# Patient Record
Sex: Male | Born: 1960 | Race: Black or African American | Hispanic: No | Marital: Married | Smoking: Current every day smoker
Health system: Southern US, Community
[De-identification: ages and names within clinical notes are randomized; demographics above are authoritative.]

## PROBLEM LIST (undated history)

## (undated) DIAGNOSIS — R011 Cardiac murmur, unspecified: Secondary | ICD-10-CM

## (undated) DIAGNOSIS — I10 Essential (primary) hypertension: Secondary | ICD-10-CM

## (undated) HISTORY — DX: Essential (primary) hypertension: I10

## (undated) HISTORY — DX: Cardiac murmur, unspecified: R01.1

---

## 2004-08-22 ENCOUNTER — Encounter: Payer: Self-pay | Admitting: Orthopedic Surgery

## 2010-03-26 ENCOUNTER — Emergency Department: Payer: Self-pay | Admitting: Emergency Medicine

## 2014-01-15 ENCOUNTER — Emergency Department (HOSPITAL_COMMUNITY)
Admission: EM | Admit: 2014-01-15 | Discharge: 2014-01-15 | Disposition: A | Discharge status: Home / Self Care | Payer: PRIVATE HEALTH INSURANCE | Attending: Emergency Medicine | Admitting: Emergency Medicine

## 2014-01-15 ENCOUNTER — Emergency Department (HOSPITAL_COMMUNITY): Payer: PRIVATE HEALTH INSURANCE

## 2014-01-15 ENCOUNTER — Encounter (HOSPITAL_COMMUNITY): Payer: Self-pay | Admitting: Emergency Medicine

## 2014-01-15 DIAGNOSIS — F17200 Nicotine dependence, unspecified, uncomplicated: Secondary | ICD-10-CM | POA: Insufficient documentation

## 2014-01-15 DIAGNOSIS — R109 Unspecified abdominal pain: Secondary | ICD-10-CM | POA: Insufficient documentation

## 2014-01-15 LAB — COMPREHENSIVE METABOLIC PANEL
ALBUMIN: 3.8 g/dL (ref 3.5–5.2)
ALT: 18 U/L (ref 0–53)
ANION GAP: 8 (ref 5–15)
AST: 18 U/L (ref 0–37)
Alkaline Phosphatase: 63 U/L (ref 39–117)
BILIRUBIN TOTAL: 0.4 mg/dL (ref 0.3–1.2)
BUN: 15 mg/dL (ref 6–23)
CALCIUM: 9.3 mg/dL (ref 8.4–10.5)
CHLORIDE: 102 meq/L (ref 96–112)
CO2: 28 mEq/L (ref 19–32)
CREATININE: 1.27 mg/dL (ref 0.50–1.35)
GFR calc Af Amer: 74 mL/min — ABNORMAL LOW (ref >90)
GFR, EST NON AFRICAN AMERICAN: 63 mL/min — AB (ref >90)
Glucose, Bld: 107 mg/dL — ABNORMAL HIGH (ref 70–99)
Potassium: 4.3 mEq/L (ref 3.7–5.3)
Sodium: 138 mEq/L (ref 137–147)
Total Protein: 7.6 g/dL (ref 6.0–8.3)

## 2014-01-15 LAB — CBC WITH DIFFERENTIAL/PLATELET
Basophils Absolute: 0 10*3/uL (ref 0.0–0.1)
Basophils Relative: 0 % (ref 0–1)
Eosinophils Absolute: 0.1 10*3/uL (ref 0.0–0.7)
Eosinophils Relative: 1 % (ref 0–5)
HCT: 45.9 % (ref 39.0–52.0)
HEMOGLOBIN: 15.7 g/dL (ref 13.0–17.0)
Lymphocytes Relative: 17 % (ref 12–46)
Lymphs Abs: 1.6 10*3/uL (ref 0.7–4.0)
MCH: 29.8 pg (ref 26.0–34.0)
MCHC: 34.2 g/dL (ref 30.0–36.0)
MCV: 87.3 fL (ref 78.0–100.0)
MONO ABS: 0.6 10*3/uL (ref 0.1–1.0)
MONOS PCT: 6 % (ref 3–12)
NEUTROS ABS: 7.1 10*3/uL (ref 1.7–7.7)
Neutrophils Relative %: 76 % (ref 43–77)
Platelets: 289 10*3/uL (ref 150–400)
RBC: 5.26 MIL/uL (ref 4.22–5.81)
RDW: 13.8 % (ref 11.5–15.5)
WBC: 9.5 10*3/uL (ref 4.0–10.5)

## 2014-01-15 LAB — URINE MICROSCOPIC-ADD ON

## 2014-01-15 LAB — URINALYSIS, ROUTINE W REFLEX MICROSCOPIC
Bilirubin Urine: NEGATIVE
Glucose, UA: NEGATIVE mg/dL
Ketones, ur: NEGATIVE mg/dL
LEUKOCYTES UA: NEGATIVE
NITRITE: NEGATIVE
PH: 6 (ref 5.0–8.0)
PROTEIN: NEGATIVE mg/dL
Specific Gravity, Urine: 1.01 (ref 1.005–1.030)
Urobilinogen, UA: 0.2 mg/dL (ref 0.0–1.0)

## 2014-01-15 MED ORDER — TRAMADOL HCL 50 MG PO TABS: 50.0000 mg | ORAL_TABLET | Freq: Four times a day (QID) | ORAL | Status: DC | PRN

## 2014-01-15 MED ORDER — NAPROXEN 250 MG PO TABS: 250.0000 mg | ORAL_TABLET | Freq: Two times a day (BID) | ORAL | Status: DC | PRN

## 2014-01-15 MED ORDER — METHOCARBAMOL 500 MG PO TABS: 1000.0000 mg | ORAL_TABLET | Freq: Four times a day (QID) | ORAL | Status: DC | PRN

## 2014-01-15 NOTE — Discharge Instructions (Signed)
°Emergency Department Resource Guide °1) Find a Doctor and Pay Out of Pocket °Although you won't have to find out who is covered by your insurance plan, it is a good idea to ask around and get recommendations. You will then need to call the office and see if the doctor you have chosen will accept you as a new patient and what types of options they offer for patients who are self-pay. Some doctors offer discounts or will set up payment plans for their patients who do not have insurance, but you will need to ask so you aren't surprised when you get to your appointment. ° °2) Contact Your Local Health Department °Not all health departments have doctors that can see patients for sick visits, but many do, so it is worth a call to see if yours does. If you don't know where your local health department is, you can check in your phone book. The CDC also has a tool to help you locate your state's health department, and many state websites also have listings of all of their local health departments. ° °3) Find a Walk-in Clinic °If your illness is not likely to be very severe or complicated, you may want to try a walk in clinic. These are popping up all over the country in pharmacies, drugstores, and shopping centers. They're usually staffed by nurse practitioners or physician assistants that have been trained to treat common illnesses and complaints. They're usually fairly quick and inexpensive. However, if you have serious medical issues or chronic medical problems, these are probably not your best option. ° °No Primary Care Doctor: °- Call [facility] at  832-8000 - they can help you locate a primary care doctor that  accepts your insurance, provides certain services, etc. °- Physician Referral Service- 1-800-533-3463 ° °Chronic Pain Problems: °Organization         Address  Phone   Notes  °[facility]  (336) 297-2271 Patients need to be referred by their primary care doctor.  ° °Medication  Assistance: °Organization         Address  Phone   Notes  °[place] [facility] [address] °[place], [place] [address] (336) 641-8030 --Must be a resident of [place] °-- Must have NO insurance coverage whatsoever (no Medicaid/ Medicare, etc.) °-- The pt. MUST have a primary care doctor that directs their care regularly and follows them in the community °  °[facility]  (866) 331-1348   °[employer]  (888) 892-1162   ° °Agencies that provide inexpensive medical care: °Organization         Address  Phone   Notes  °[facility]  (336) 832-8035   °[facility]    (336) 832-7272   °[facility] [address] °[place], [place] [address] (336) 832-4777   °[facility] [address], °[place] (336) 271-4999   °[facility]    (336) 373-0678   °[facility]    (336) 272-1050   °[facility] ° [address], [place] Phone:  (336) 832-4444, Fax:  (336) 832-4440 Hours of Operation:  9 am - 6 pm, M-F.  Also accepts Medicaid/Medicare and self-pay.  °[facility] ° [address], [place] Phone: (336) 832-3150, Fax: (336) 832-3151. Hours of Operation:  8:30 am - 5:30 pm, M-F.  Also accepts Medicaid and self-pay.  °[facility] [place] [address], [place] Phone: (336) 878-6027   °[facility] [address], [place], [place] (336)723-1848, Ext. 123 Mondays & Thursdays: 7-9 AM.  First 15 patients are seen on a first come, first serve basis. °  ° °Medicaid-accepting [place] Providers: ° °Organization         Address  Phone   Notes  °[facility] [address], [place] (336) 641-2100 Also accepts self-pay patients.  °[facility] [address], [place] ° (336) 856-9996   °[facility] [address], [place]  (336) 288-8857   °[facility] [address] [place] [address], [place] (336) 299-7000   °Veita Bland [address], [place]  ° (336) 373-1557 Only accepts [place] Access Medicaid patients after they have their name applied to their card.  ° °Self-Pay (no insurance) in [place]: ° °Organization         Address  Phone   Notes  °Sickle Cell Patients, [facility] [address], [place] (336) 832-1970   °[facility] [address], [place] (336) 832-4400   ° [facility] ° [address], [place] (336) 992-4800   °[facility]/Dr. Osei-Bonsu ° [address] [place] [address], [place] or [address], [place] (336) 841-8500 Phone number for both [place] and [place] locations is the same.  °[facility] [address], [place] (336) 299-0000   °[facility] [address] [place] [address], [place] or [address] (336) 852-7530 °(336) 878-2260   °[facility] [address], [place] (336) 350-1642, phone; (336) 294-5005, fax Sees patients 1st and 3rd Saturday of every month.  Must not qualify for public or private insurance (i.e. Medicaid, Medicare, [place] Health Choice, Veterans' Benefits) • Household income should be no more than 200% of the poverty level •The clinic cannot treat you if you are pregnant or think you are pregnant • Sexually transmitted diseases are not treated at the clinic.  ° ° °Dental Care: °Organization         Address  Phone  Notes  °[place] [facility] [address], [place] (336) 641-6152 Accepts children up to age 21 who are enrolled in Medicaid or [place] Health Choice; pregnant women with a Medicaid card; and children who have applied for Medicaid or [place] Health Choice, but were declined, whose parents can pay a reduced fee at time of service.  °[place]  [facility] [place]  [address], [place] (336) 641-7733 Accepts children up to age 21 who are enrolled in Medicaid or [place] Health Choice; pregnant women with a Medicaid card; and children who have applied for Medicaid or [place] Health Choice, but were declined, whose parents can pay a reduced fee at time of service.  °[facility] ° [address], [place] (336) 641-4533 Patients are seen by appointment only. Walk-ins are not accepted. [facility] will see patients 18 years of age and older. °Monday - Tuesday (8am-5pm) °Most Wednesdays (8:30-5pm) °$30 per visit, cash only  °[facility] ° [address], [place] (336) 641-4533 Patients are seen by appointment only. Walk-ins are not accepted. [facility] will see patients 18 years of age and older. °One   Wednesday Evening (Monthly: Volunteer Based).  $30 per visit, cash only  °[facility]  (919) 537-3737 for adults; Children under age 4, call Graduate Pediatric Dentistry at ([phone]) [phone]. Children aged 4-14, please call (919) [phone] to request a pediatric application. ° Dental services are provided in all areas of dental care including fillings, crowns and bridges, complete and partial dentures, implants, gum treatment, root canals, and extractions. Preventive care is also provided. Treatment is provided to both adults and children. °Patients are selected via a lottery and there is often a waiting list. °  °[facility] [address], °[place] ° (336) 763-8833 [email or web address] °  °[facility] [address], [place], [place] (336)723-1848, Ext. 123 Second and Fourth Thursday of each month, opens at 6:30 AM; Clinic ends at 9 AM.  Patients are seen on a first-come first-served basis, and a limited number are seen during each clinic.  ° °[facility] ° [address], [place], [place] (336) [phone]    Eligibility Requirements °You must have lived in [place], [place], or [place] counties for at least the last three months. °  You cannot be eligible for state or federal sponsored healthcare insurance, including Veterans Administration, Medicaid, or Medicare. °  You generally cannot be eligible for healthcare insurance through your employer.  °  How to apply: °Eligibility screenings are held every Tuesday and Wednesday afternoon from 1:00 pm until 4:00 pm. You do not need an appointment for the interview!  °[facility] [address], [place], [place] 336-631-2330   °[facility]  336-342-8273   °[facility]  336-703-3100   °[facility]  336-570-6415   ° °Behavioral Health Resources in the Community: °Intensive Outpatient Programs °Organization         Address  Phone  Notes  °[facility] [address], [place], [place] 336-878-6098   °[facility] [address], [place], [place] 336-832-9800   °[facility] [address], [place], [place] ° 336-882-2125   °[facility] [address],  °[place], [place] 1-800-853-5163 or 336-641-4981   °Substance Abuse Resources °Organization         Address  Phone  Notes  °[facility]  336-882-2125   °[facility]  336-784-9470   °[facility]  336-285-9073   °[facility]  336-845-3988   °Residential & Outpatient Substance Abuse Program  1-800-659-3381   °Psychological Services °Organization         Address  Phone  Notes  °[facility]  336- 832-9600   °[facility]  336- 378-7881   °[facility] [address], [place] 1-800-853-5163 or 336-641-4981   ° °Mobile Crisis Teams °Organization         Address  Phone  Notes  °[facility], Mobile Crisis Care Unit  1-877-626-1772   °Assertive °[facility] ° [address]  [place], [place] 336-834-9664   °Sharon DeEsch [address] °[place] 336-554-5454   ° °Self-Help/Support Groups °Organization         Address  Phone             Notes  °[facility] - variety of support groups  336- 373-1402 Call for more information  °Narcotics Anonymous (NA), [facility] [address], °[place]  2 meetings at this location  ° °  Residential Treatment Programs °Organization         Address  Phone  Notes  °[facility] [address],    °[place]  1-866-801-8205   °[facility] ° [address], [place], [place] 704-293-8524   °[facility] [address], [place] 336-845-3988 Admissions: 8am-3pm M-F  °[facility] [address]    °[place], [place] 336-841-1104   °[facility] [address], [place], [place] 336-379-7146   °[facility] [address]  °[place], [place] 336-285-9073   °[facility] - Intensive Outpatient [address], [place], [place] 336-852-3033   °[facility] [address]  °[place], [place] 1-877-615-2722 or 336-784-9470   °[facility] [address] [place], [place] 336-227-7417 Accepts Medicaid  °[facility] [address]  °[place] 1-800-659-3381 Substance Abuse/Addiction Treatment  ° °[place] Behavioral Health Resources °Organization         Address  Phone  Notes  °[facility]  (888) 581-9988   °Julie Brannon, PhD [address] [place], [place]   (336) 349-5553 or (336) 951-0000   °[facility]   [address] °[place], [place] (336) 349-4454   °[facility] [address], [place], [place] (336) 342-8316 Insurance/Medicaid/sponsorship through [facility]  °[facility] [address]                                    [place], [place] (336) 342-8316 Therapy/tele-psych/case    °[facility] [address]  ° [place], [place] (336) 349-2233    °Dr. Arfeen  (336) 349-4544   °[facility] [place]  [employer] [place] [facility] 1) [address], [place] °2) [address], [place] °3)  [address], [place] (336) 349-3220 °(336) 342-7768 ° °(336) 342-8140   °[place] [facility] (336) 342-1394 or (336) 342-3537 (After Hours)    ° ° °Take the prescriptions as directed.  Apply moist heat or ice to the area(s) of discomfort, for 15 minutes at a time, several times per day for the next few days.  Do not fall asleep on a heating or ice pack.  Call your regular medical doctor on Monday to schedule a follow up appointment this week.  Return to the Emergency Department immediately if worsening. ° °

## 2014-01-15 NOTE — ED Provider Notes (Signed)
CSN: [id number]     Arrival date & time 01/15/14  1235 History   First MD Initiated Contact with Patient 01/15/14 1357     Chief Complaint  Patient presents with  . Flank Pain     HPI Pt was seen at 1410. Per pt, c/o sudden onset and persistence of waxing and waning right sided flank "pain" for the past 1 week.  Pt describes the pain as "throbbing," and radiating into the right side of his abd. Pain worsens with certain body positions.  Denies injury, no testicular pain/swelling, no dysuria/hematuria, no abd pain, no N/V, no diarrhea, no CP/SOB.    History reviewed. No pertinent past medical history.  History reviewed. No pertinent past surgical history.  History  Substance Use Topics  . Smoking status: Current Every Day Smoker -- 1.00 packs/day    Types: Cigarettes  . Smokeless tobacco: Not on file  . Alcohol Use: Yes     Comment: on weekends    Review of Systems ROS: Statement: All systems negative except as marked or noted in the HPI; Constitutional: Negative for fever and chills. ; ; Eyes: Negative for eye pain, redness and discharge. ; ; ENMT: Negative for ear pain, hoarseness, nasal congestion, sinus pressure and sore throat. ; ; Cardiovascular: Negative for chest pain, palpitations, diaphoresis, dyspnea and peripheral edema. ; ; Respiratory: Negative for cough, wheezing and stridor. ; ; Gastrointestinal: Negative for nausea, vomiting, diarrhea, abdominal pain, blood in stool, hematemesis, jaundice and rectal bleeding. . ; ; Genitourinary: +right sided flank pain. Negative for dysuria and hematuria. ; ; Genital:  No penile drainage or rash, no testicular pain or swelling, no scrotal rash or swelling. ;; Musculoskeletal: Negative for back pain and neck pain. Negative for swelling and trauma.; ; Skin: Negative for pruritus, rash, abrasions, blisters, bruising and skin lesion.; ; Neuro: Negative for headache, lightheadedness and neck stiffness. Negative for weakness, altered level of  consciousness , altered mental status, extremity weakness, paresthesias, involuntary movement, seizure and syncope.      Allergies  Review of patient's allergies indicates no known allergies.  Home Medications   Prior to Admission medications   Not on File   BP 169/109  Pulse 76  Temp(Src) 98.6 F (37 C) (Oral)  Resp 16  Ht 5\' 6"  (1.676 m)  Wt 150 lb (68.04 kg)  BMI 24.22 kg/m2  SpO2 100% Physical Exam 1415: Physical examination:  Nursing notes reviewed; Vital signs and O2 SAT reviewed;  Constitutional: Well developed, Well nourished, Well hydrated, In no acute distress; Head:  Normocephalic, atraumatic; Eyes: EOMI, PERRL, No scleral icterus; ENMT: Mouth and pharynx normal, Mucous membranes moist; Neck: Supple, Full range of motion, No lymphadenopathy; Cardiovascular: Regular rate and rhythm, No murmur, rub, or gallop; Respiratory: Breath sounds clear & equal bilaterally, No rales, rhonchi, wheezes.  Speaking full sentences with ease, Normal respiratory effort/excursion; Chest: Nontender, Movement normal; Abdomen: Soft, Nontender, Nondistended, Normal bowel sounds; Genitourinary: No CVA tenderness; Spine:  No midline CS, TS, LS tenderness.;; Extremities: Pulses normal, No tenderness, No edema, No calf edema or asymmetry.; Neuro: AA&Ox3, Major CN grossly intact.  Speech clear. No gross focal motor or sensory deficits in extremities. Climbs on and off stretcher easily by himself. Gait steady.; Skin: Color normal, Warm, Dry.   ED Course  Procedures     EKG Interpretation None      MDM  MDM Reviewed: nursing note and vitals Interpretation: CT scan and labs   Results for orders placed during the hospital encounter  of 01/15/14  CBC WITH DIFFERENTIAL      Result Value Ref Range   WBC 9.5  4.0 - 10.5 K/uL   RBC 5.26  4.22 - 5.81 MIL/uL   Hemoglobin 15.7  13.0 - 17.0 g/dL   HCT 45.9  39.0 - 52.0 %   MCV 87.3  78.0 - 100.0 fL   MCH 29.8  26.0 - 34.0 pg   MCHC 34.2  30.0 - 36.0  g/dL   RDW 13.8  11.5 - 15.5 %   Platelets 289  150 - 400 K/uL   Neutrophils Relative % 76  43 - 77 %   Neutro Abs 7.1  1.7 - 7.7 K/uL   Lymphocytes Relative 17  12 - 46 %   Lymphs Abs 1.6  0.7 - 4.0 K/uL   Monocytes Relative 6  3 - 12 %   Monocytes Absolute 0.6  0.1 - 1.0 K/uL   Eosinophils Relative 1  0 - 5 %   Eosinophils Absolute 0.1  0.0 - 0.7 K/uL   Basophils Relative 0  0 - 1 %   Basophils Absolute 0.0  0.0 - 0.1 K/uL  COMPREHENSIVE METABOLIC PANEL      Result Value Ref Range   Sodium 138  137 - 147 mEq/L   Potassium 4.3  3.7 - 5.3 mEq/L   Chloride 102  96 - 112 mEq/L   CO2 28  19 - 32 mEq/L   Glucose, Bld 107 (*) 70 - 99 mg/dL   BUN 15  6 - 23 mg/dL   Creatinine, Ser 1.27  0.50 - 1.35 mg/dL   Calcium 9.3  8.4 - 10.5 mg/dL   Total Protein 7.6  6.0 - 8.3 g/dL   Albumin 3.8  3.5 - 5.2 g/dL   AST 18  0 - 37 U/L   ALT 18  0 - 53 U/L   Alkaline Phosphatase 63  39 - 117 U/L   Total Bilirubin 0.4  0.3 - 1.2 mg/dL   GFR calc non Af Amer 63 (*) >90 mL/min   GFR calc Af Amer 74 (*) >90 mL/min   Anion gap 8  5 - 15  URINALYSIS, ROUTINE W REFLEX MICROSCOPIC      Result Value Ref Range   Color, Urine YELLOW  YELLOW   APPearance CLEAR  CLEAR   Specific Gravity, Urine 1.010  1.005 - 1.030   pH 6.0  5.0 - 8.0   Glucose, UA NEGATIVE  NEGATIVE mg/dL   Hgb urine dipstick SMALL (*) NEGATIVE   Bilirubin Urine NEGATIVE  NEGATIVE   Ketones, ur NEGATIVE  NEGATIVE mg/dL   Protein, ur NEGATIVE  NEGATIVE mg/dL   Urobilinogen, UA 0.2  0.0 - 1.0 mg/dL   Nitrite NEGATIVE  NEGATIVE   Leukocytes, UA NEGATIVE  NEGATIVE  URINE MICROSCOPIC-ADD ON      Result Value Ref Range   Squamous Epithelial / LPF RARE  RARE   WBC, UA 0-2  <3 WBC/hpf   RBC / HPF 0-2  <3 RBC/hpf   Bacteria, UA RARE  RARE   Ct Abdomen Pelvis Wo Contrast 01/15/2014   CLINICAL DATA:  Right flank pain.  EXAM: CT ABDOMEN AND PELVIS WITHOUT CONTRAST  TECHNIQUE: Multidetector CT imaging of the abdomen and pelvis was performed  following the standard protocol without IV contrast.  COMPARISON:  None.  FINDINGS: Visualized lung bases appear normal. No significant osseous abnormality is noted.  No gallstones are noted. No focal abnormality is noted in the liver,  spleen or pancreas on these unenhanced images. Adrenal glands appear normal. No hydronephrosis or renal obstruction is noted. No renal or ureteral calculi are noted. The appendix appears normal. There is no evidence of bowel obstruction. No abnormal fluid collection is noted. Urinary bladder appears normal. No significant adenopathy is noted.  IMPRESSION: No hydronephrosis or renal obstruction is noted. No renal or ureteral calculi are noted. No acute abnormality seen in the abdomen or pelvis.   Electronically Signed   By: Sabino Dick M.D.   On: 01/15/2014 16:00    1620:  Workup reassuring. Pt wants to go home now. Dx and testing d/w pt and family.  Questions answered.  Verb understanding, agreeable to d/c home with outpt f/u.   Francine Graven, DO 01/18/14 2214

## 2014-01-15 NOTE — ED Notes (Signed)
Pt states that he had right sided flank pain that started on 9/23. Pt states the pain is worse when he lays down. Denies any change in urination.

## 2016-05-02 ENCOUNTER — Encounter: Payer: Self-pay | Admitting: Orthopedic Surgery

## 2016-11-08 ENCOUNTER — Encounter: Payer: Self-pay | Admitting: Urology

## 2016-11-08 ENCOUNTER — Ambulatory Visit (INDEPENDENT_AMBULATORY_CARE_PROVIDER_SITE_OTHER): Payer: PRIVATE HEALTH INSURANCE | Admitting: Urology

## 2016-11-08 VITALS — BP 133/80 | HR 76 | Resp 12 | Ht 67.0 in | Wt 143.0 lb

## 2016-11-08 DIAGNOSIS — R3121 Asymptomatic microscopic hematuria: Secondary | ICD-10-CM

## 2016-11-08 LAB — URINALYSIS, COMPLETE
Bilirubin, UA: NEGATIVE
GLUCOSE, UA: NEGATIVE
Ketones, UA: NEGATIVE
Leukocytes, UA: NEGATIVE
Nitrite, UA: NEGATIVE
Protein, UA: NEGATIVE
Specific Gravity, UA: 1.015 (ref 1.005–1.030)
Urobilinogen, Ur: 0.2 mg/dL (ref 0.2–1.0)
pH, UA: 5.5 (ref 5.0–7.5)

## 2016-11-08 LAB — MICROSCOPIC EXAMINATION
Bacteria, UA: NONE SEEN
Epithelial Cells (non renal): NONE SEEN /hpf (ref 0–10)
WBC UA: NONE SEEN /HPF (ref 0–?)

## 2016-11-08 NOTE — Progress Notes (Signed)
11/08/2016 1:30 PM   David Carney 1961/04/10 [id number]  Referring provider: Ivan Anchors, FNP [address] [place], [place] [address]  Chief Complaint  Patient presents with  . Other    Painless hematuria    HPI: The patient is a 56 year old gentleman presents today to discuss microscopic hematuria.  1. Microscopic hematuria Patient with multiple urinalysis that showed red blood cells on microscopy. He notes no gross hematuria or clots. No urinary tract infections.  He is known history of nephrolithiasis. He does currently smoke. He has no other complaints at this time. On questioning, he does endorse a weak stream but he feels he empties his bladder and has no nocturia. He takes no medication for an enlarged prostate. Currently not significantly bothered by symptoms.   PMH: Past Medical History:  Diagnosis Date  . Heart murmur   . Hypertension     Surgical History: No past surgical history on file.  Home Medications:  Allergies as of 11/08/2016      Reactions   Codeine Swelling      Medication List       Accurate as of 11/08/16  1:30 PM. Always use your most recent med list.          aspirin EC 81 MG tablet Take 81 mg by mouth daily.   Fish Oil 1200 MG Caps Take 1,200 mg by mouth daily.   hydrochlorothiazide 25 MG tablet Commonly known as:  HYDRODIURIL Take 25 mg by mouth daily.       Allergies:  Allergies  Allergen Reactions  . Codeine Swelling    Family History: No family history on file.  Social History:  reports that he has been smoking Cigarettes.  He has been smoking about 1.00 pack per day. He has never used smokeless tobacco. He reports that he drinks alcohol. He reports that he does not use drugs.  ROS: UROLOGY Frequent Urination?: No Hard to postpone urination?: No Burning/pain with urination?: No Get up at night to urinate?: No Leakage of urine?: No Urine stream starts and stops?: No Trouble starting stream?: No Do  you have to strain to urinate?: No Blood in urine?: Yes Urinary tract infection?: No Sexually transmitted disease?: No Injury to kidneys or bladder?: No Painful intercourse?: No Weak stream?: No Erection problems?: No Penile pain?: No  Gastrointestinal Nausea?: No Vomiting?: No Indigestion/heartburn?: Yes Diarrhea?: No Constipation?: Yes  Constitutional Fever: No Night sweats?: No Weight loss?: No Fatigue?: No  Skin Skin rash/lesions?: Yes Itching?: Yes  Eyes Blurred vision?: Yes Double vision?: No  Ears/Nose/Throat Sore throat?: No Sinus problems?: Yes  Hematologic/Lymphatic Swollen glands?: No Easy bruising?: No  Cardiovascular Leg swelling?: No Chest pain?: No  Respiratory Cough?: No Shortness of breath?: No  Endocrine Excessive thirst?: No  Musculoskeletal Back pain?: Yes Joint pain?: Yes  Neurological Headaches?: Yes Dizziness?: No  Psychologic Depression?: No Anxiety?: No  Physical Exam: BP 133/80   Pulse 76   Resp 12   Ht 5\' 7"  (1.702 m)   Wt 143 lb (64.9 kg)   BMI 22.40 kg/m   Constitutional:  Alert and oriented, No acute distress. HEENT: [place] AT, moist mucus membranes.  Trachea midline, no masses. Cardiovascular: No clubbing, cyanosis, or edema. Respiratory: Normal respiratory effort, no increased work of breathing. GI: Abdomen is soft, nontender, nondistended, no abdominal masses GU: No CVA tenderness.  Skin: No rashes, bruises or suspicious lesions. Lymph: No cervical or inguinal adenopathy. Neurologic: Grossly intact, no focal deficits, moving all 4  extremities. Psychiatric: Normal mood and affect.  Laboratory Data: Lab Results  Component Value Date   WBC 9.5 01/15/2014   HGB 15.7 01/15/2014   HCT 45.9 01/15/2014   MCV 87.3 01/15/2014   PLT 289 01/15/2014    Lab Results  Component Value Date   CREATININE 1.27 01/15/2014    No results found for: PSA  No results found for: TESTOSTERONE  No results found for:  HGBA1C  Urinalysis    Component Value Date/Time   COLORURINE YELLOW 01/15/2014 1310   APPEARANCEUR CLEAR 01/15/2014 1310   LABSPEC 1.010 01/15/2014 1310   PHURINE 6.0 01/15/2014 1310   GLUCOSEU NEGATIVE 01/15/2014 1310   HGBUR SMALL (A) 01/15/2014 1310   BILIRUBINUR NEGATIVE 01/15/2014 1310   KETONESUR NEGATIVE 01/15/2014 1310   PROTEINUR NEGATIVE 01/15/2014 1310   UROBILINOGEN 0.2 01/15/2014 1310   NITRITE NEGATIVE 01/15/2014 [place] 01/15/2014 1310    Assessment & Plan:    1. Microscopic hematuria I discussed the patient the classic hematuria workup. He is agreeable to proceeding. He'll follow-up for cystoscopy after undergoing CT urogram  Return for after CT for cystoscopy.  Nickie Retort, MD  [facility] [address] [place], [place] [address] [phone]

## 2016-12-13 ENCOUNTER — Encounter (HOSPITAL_COMMUNITY): Payer: Self-pay

## 2016-12-13 ENCOUNTER — Ambulatory Visit (HOSPITAL_COMMUNITY)
Admission: RE | Admit: 2016-12-13 | Discharge: 2016-12-13 | Disposition: A | Discharge status: Home / Self Care | Payer: PRIVATE HEALTH INSURANCE | Source: Ambulatory Visit | Attending: Urology | Admitting: Urology

## 2016-12-13 DIAGNOSIS — D1809 Hemangioma of other sites: Secondary | ICD-10-CM | POA: Insufficient documentation

## 2016-12-13 DIAGNOSIS — R911 Solitary pulmonary nodule: Secondary | ICD-10-CM | POA: Insufficient documentation

## 2016-12-13 DIAGNOSIS — R3121 Asymptomatic microscopic hematuria: Secondary | ICD-10-CM | POA: Diagnosis present

## 2016-12-13 MED ORDER — IOPAMIDOL (ISOVUE-300) INJECTION 61%
125.0000 mL | Freq: Once | INTRAVENOUS | Status: AC | PRN
  Administered 2016-12-13: 125 mL via INTRAVENOUS

## 2016-12-20 ENCOUNTER — Encounter: Payer: Self-pay | Admitting: Urology

## 2016-12-20 ENCOUNTER — Ambulatory Visit (INDEPENDENT_AMBULATORY_CARE_PROVIDER_SITE_OTHER): Payer: PRIVATE HEALTH INSURANCE | Admitting: Urology

## 2016-12-20 VITALS — BP 122/67 | HR 91 | Ht 67.0 in | Wt 145.2 lb

## 2016-12-20 DIAGNOSIS — R3121 Asymptomatic microscopic hematuria: Secondary | ICD-10-CM

## 2016-12-20 LAB — URINALYSIS, COMPLETE
Bilirubin, UA: NEGATIVE
Glucose, UA: NEGATIVE
KETONES UA: NEGATIVE
LEUKOCYTES UA: NEGATIVE
Nitrite, UA: NEGATIVE
PH UA: 5.5 (ref 5.0–7.5)
Protein, UA: NEGATIVE
SPEC GRAV UA: 1.015 (ref 1.005–1.030)
Urobilinogen, Ur: 0.2 mg/dL (ref 0.2–1.0)

## 2016-12-20 LAB — MICROSCOPIC EXAMINATION
BACTERIA UA: NONE SEEN
EPITHELIAL CELLS (NON RENAL): NONE SEEN /HPF (ref 0–10)
WBC, UA: NONE SEEN /hpf (ref 0–?)

## 2016-12-20 MED ORDER — LIDOCAINE HCL 2 % EX GEL
1.0000 "application " | Freq: Once | CUTANEOUS | Status: AC
  Administered 2016-12-20: 1 via URETHRAL

## 2016-12-20 MED ORDER — CIPROFLOXACIN HCL 500 MG PO TABS
500.0000 mg | ORAL_TABLET | Freq: Once | ORAL | Status: AC
  Administered 2016-12-20: 500 mg via ORAL

## 2016-12-20 NOTE — Progress Notes (Signed)
   12/20/16  CC:  Chief Complaint  Patient presents with  . Cysto    HPI: The patient is a 56 year old gentleman presents today for completion of his microscopic hematuria workup. His CT scan showed no obvious source for his microscopic hematuria.  There were no vitals taken for this visit. NED. A&Ox3.   No respiratory distress   Abd soft, NT, ND Normal phallus with bilateral descended testicles  Cystoscopy Procedure Note  Patient identification was confirmed, informed consent was obtained, and patient was prepped using Betadine solution.  Lidocaine jelly was administered per urethral meatus.    Preoperative abx where received prior to procedure.     Pre-Procedure: - Inspection reveals a normal caliber ureteral meatus.  Procedure: The flexible cystoscope was introduced without difficulty - No urethral strictures/lesions are present. - Enlarged prostate Hypervascular - Normal bladder neck - Bilateral ureteral orifices identified - Bladder mucosa  reveals no ulcers, tumors, or lesions - No bladder stones - No trabeculation  Retroflexion shows no intravesical lobe   Post-Procedure: - Patient tolerated the procedure well  Assessment/ Plan:  1. Microscopic hematuria -Negative workup. Urinalysis in one year. 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  2. Lung nodule Patient with incidental 3 mm left lower lobe pulmonary nodule. No follow-up is needed if he is low risk, but a CT scan in 1 year is recommended if he is high-risk per the radiologist. I have given him a copy of this report discusses risk category and to assess if he needs further imaging with his primary care provider. He expressed an understanding.

## 2017-12-19 ENCOUNTER — Ambulatory Visit: Payer: PRIVATE HEALTH INSURANCE

## 2018-07-20 IMAGING — CT CT ABD-PEL WO/W CM
3 of 12 series · 12 of 46 positions shown, 18 images · IV contrast (iopamidol)
Comparison: 01/15/2014

CLINICAL DATA: Discolored urine. Microscopic hematuria. Dysuria.
History of renal calculi.

EXAM:
CT ABDOMEN AND PELVIS WITHOUT AND WITH CONTRAST
TECHNIQUE: Multidetector CT imaging of the abdomen and pelvis was performed
following the standard protocol before and following the bolus
administration of intravenous contrast.
CONTRAST:  125mL 24T3LY-MBB IOPAMIDOL (24T3LY-MBB) INJECTION 61%

[Series 2: axial pre · axial · non-contrast · 0.72mm/px · z∈[+1140,+1470]mm · 7 of 88 slices shown, 12 images]
[im 11/88  soft-tissue]
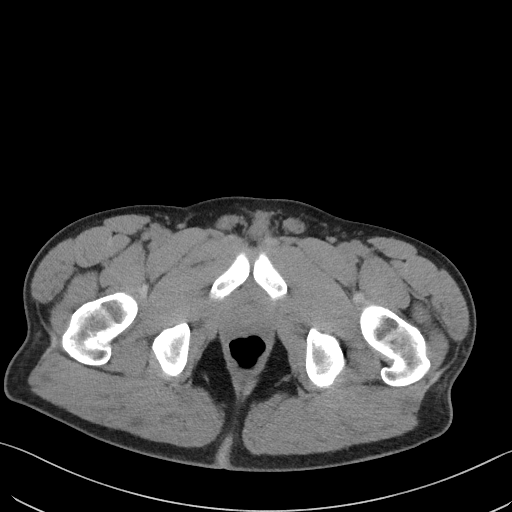
[im 11/88  bone]
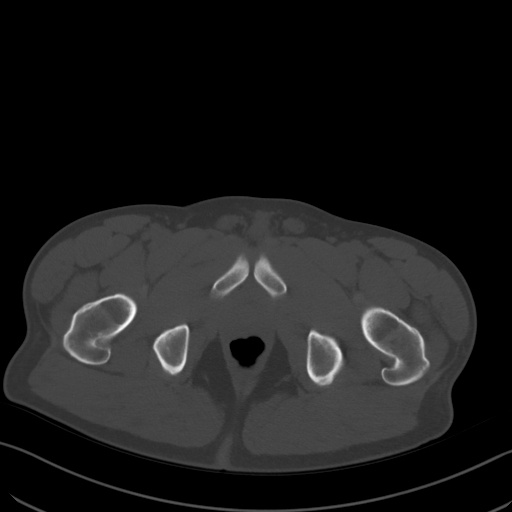
[im 22/88  soft-tissue]
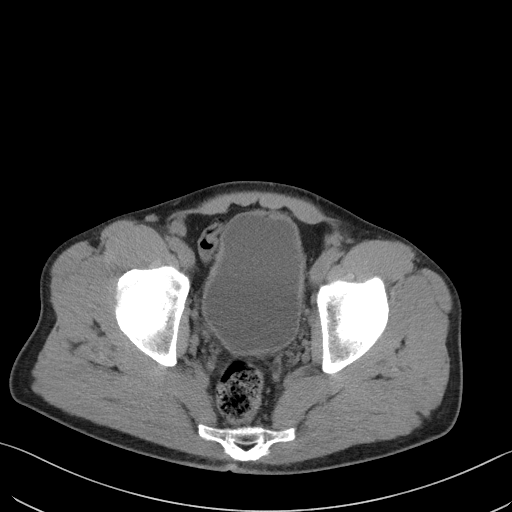
[im 33/88  soft-tissue]
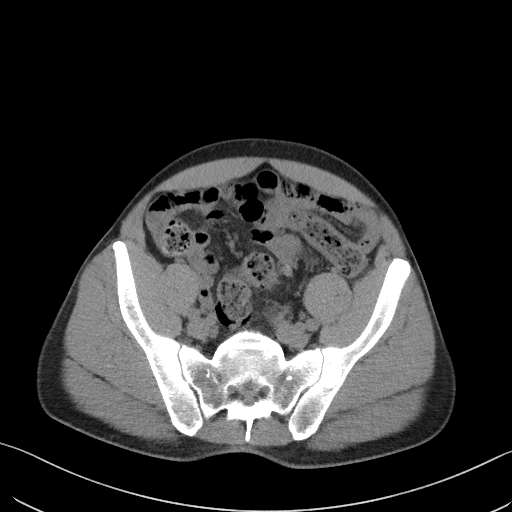
[im 44/88  soft-tissue]
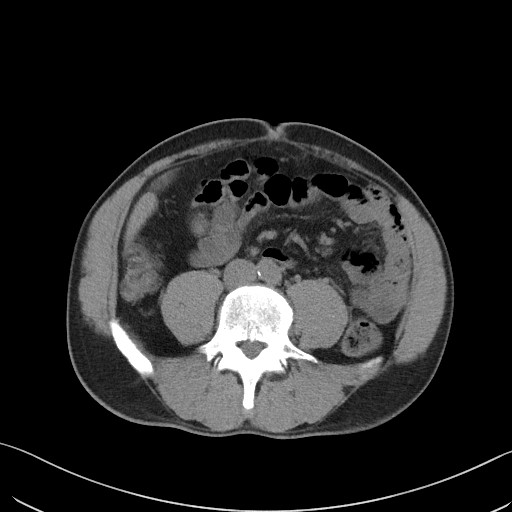
[im 44/88  lung]
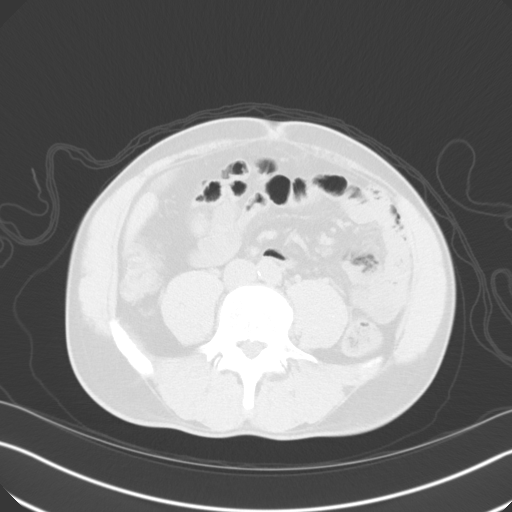
[im 55/88  soft-tissue]
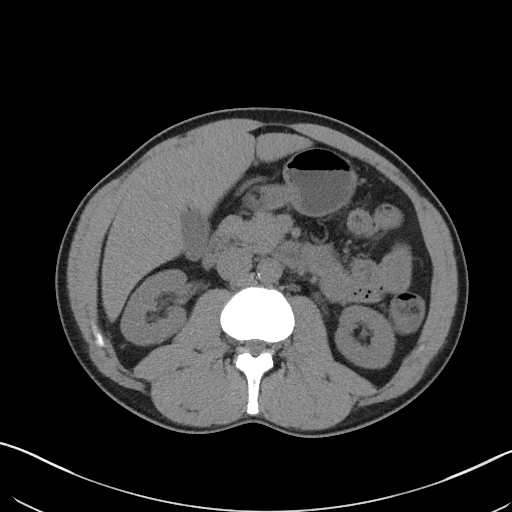
[im 55/88  lung]
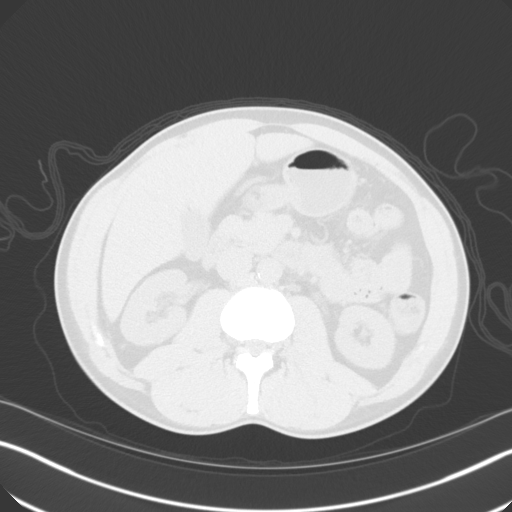
[im 66/88  soft-tissue]
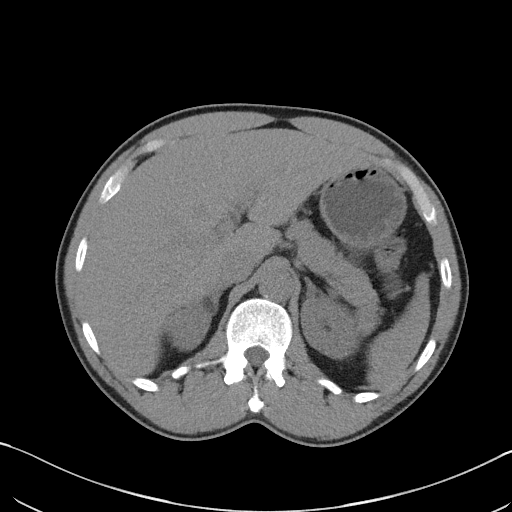
[im 66/88  lung]
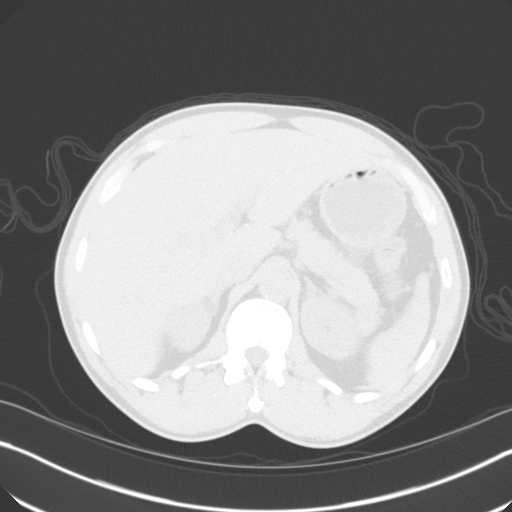
[im 77/88  soft-tissue]
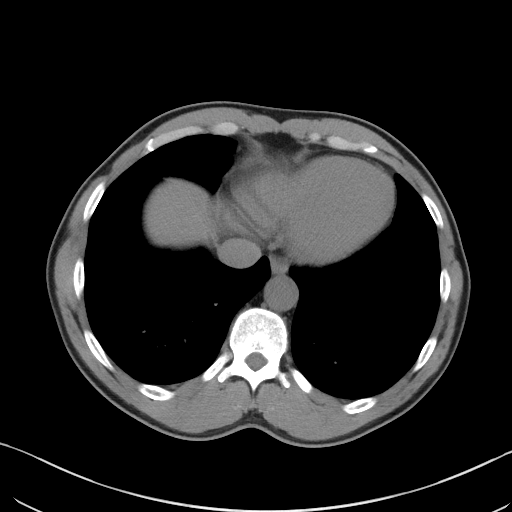
[im 77/88  lung]
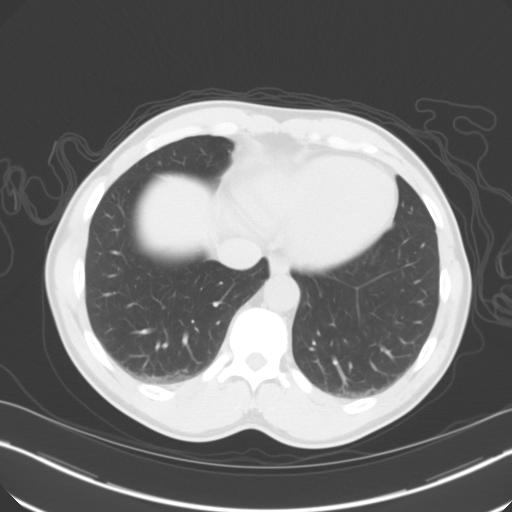

[Series 5: coronal pre · coronal · non-contrast · 0.74mm/px · 2 of 87 slices shown, 3 images]
[im 29/87  soft-tissue]
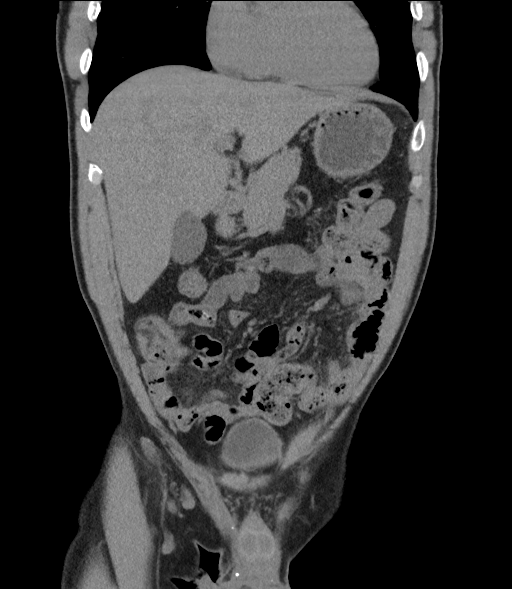
[im 29/87  bone]
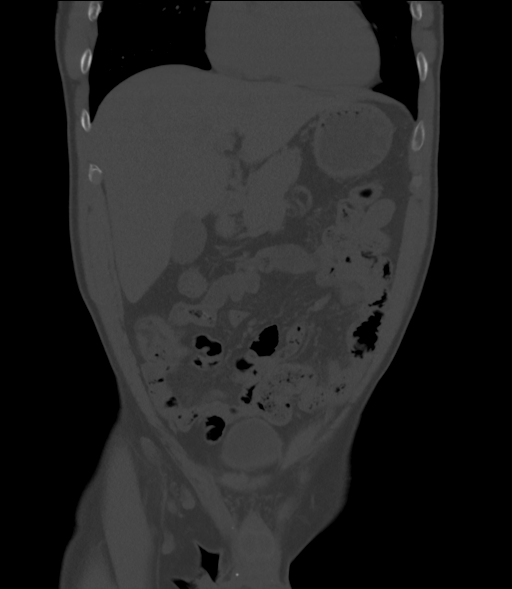
[im 58/87  soft-tissue]
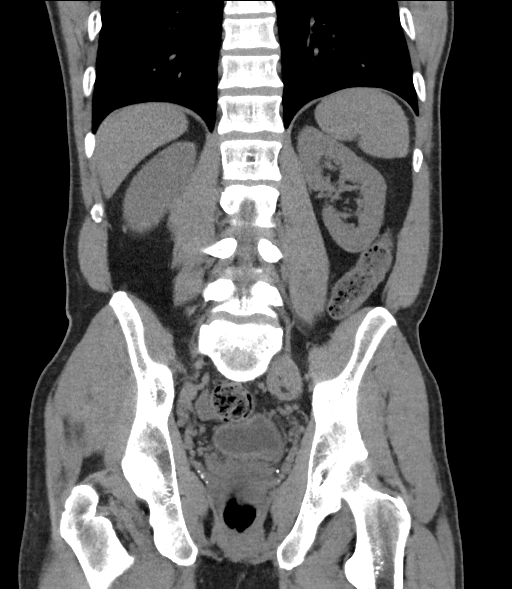

[Series 7: axial post · axial · 0.72mm/px · z∈[+1150,+1275]mm · 3 of 88 slices shown]
[im 13/88  soft-tissue]
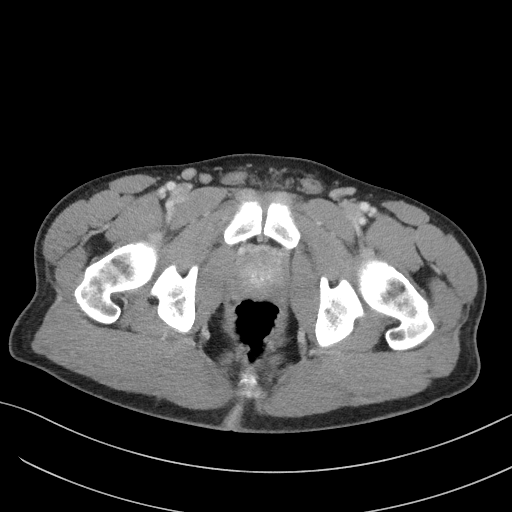
[im 25/88  soft-tissue]
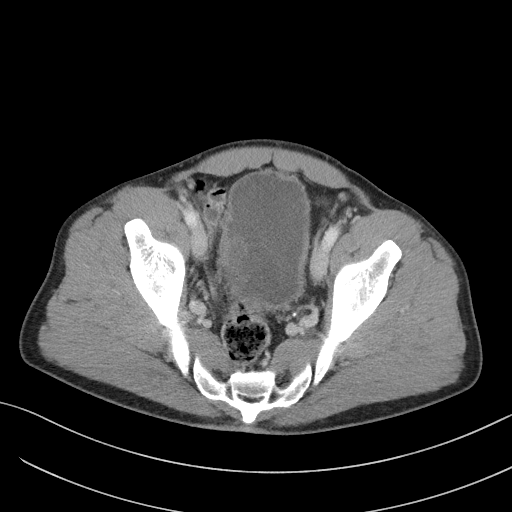
[im 38/88  soft-tissue]
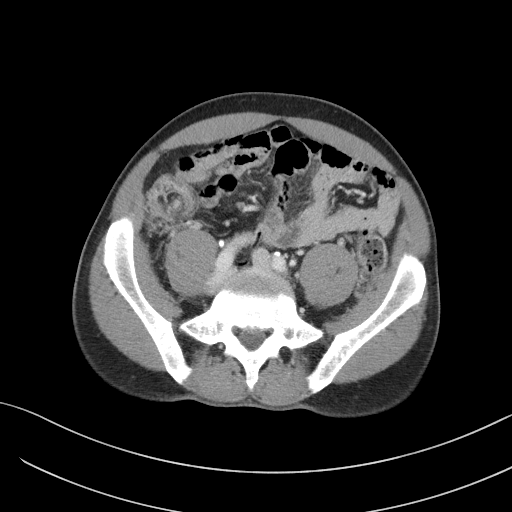

[12 of 46 positions shown; findings below may reference images not displayed]

FINDINGS: Lower chest: 3 mm left lower lobe pulmonary nodule on image [DATE].

Hepatobiliary: 2.0 by 1.6 cm hypodense lesion in the right hepatic
lobe posteriorly on image [DATE] with initial nodular enhancement and
subsequent fill in on delayed images. A corresponding hypodense
lesion measured 2.2 by 1.7 cm on 01/15/2014, and the appearance is
compatible with hepatic hemangioma.

Gallbladder unremarkable.

Pancreas: Unremarkable

Spleen: Unremarkable

Adrenals/Urinary Tract: Adrenal glands normal. No urinary tract
calculi are observed. Normal renal parenchymal enhancement. On the
immediate postcontrast phase there is contrast medium in the
collecting systems along with flow phenomenon of contrast medium in
the urinary bladder from the ureters. On the further delayed images
no filling defect is identified in the urinary bladder to suggest
that the luminal heterogeneity is due to tumor. Layering effect of
contrast in the urinary bladder noted.

Stomach/Bowel: Borderline prominence of stool distally in the colon.
Nondistended ascending colon with fatty deposition in the ascending
colon wall and questionable wall thickening. No dilated small bowel.
Appendix normal.

Vascular/Lymphatic: Aortoiliac atherosclerotic vascular disease.
Small bilateral inguinal lymph nodes are not pathologically enlarged
by size criteria.

Reproductive: Prostate gland measures 4.8 by 4.5 by 3.9 cm (volume =
44 cm^3).

Other: No supplemental non-categorized findings.

Musculoskeletal: Stable sclerotic lesion in the left iliac bone
adjacent to the SI joint, probably an enchondroma.
IMPRESSION: 1. A cause for hematuria is not identified.
2. Borderline wall thickening in the ascending colon may be
primarily from nondistention of the ascending colon and cecum.
3. Hepatic hemangioma, benign in appearance.
4. 3 mm left lower lobe pulmonary nodule. No follow-up needed if
patient is low-risk. Non-contrast chest CT can be considered in 12
months if patient is high-risk. This recommendation follows the
consensus statement: Guidelines for Management of Incidental
Pulmonary Nodules Detected on CT Images: From the [HOSPITAL]

## 2021-04-17 ENCOUNTER — Other Ambulatory Visit (HOSPITAL_COMMUNITY): Payer: Self-pay | Admitting: Emergency Medicine

## 2021-04-17 ENCOUNTER — Other Ambulatory Visit: Payer: Self-pay | Admitting: Emergency Medicine

## 2021-04-17 DIAGNOSIS — N1831 Chronic kidney disease, stage 3a: Secondary | ICD-10-CM

## 2021-05-22 ENCOUNTER — Other Ambulatory Visit: Payer: Self-pay

## 2021-05-22 ENCOUNTER — Ambulatory Visit (HOSPITAL_COMMUNITY)
Admission: RE | Admit: 2021-05-22 | Discharge: 2021-05-22 | Disposition: A | Discharge status: Home / Self Care | Payer: Self-pay | Source: Ambulatory Visit | Attending: Emergency Medicine | Admitting: Emergency Medicine

## 2021-05-22 DIAGNOSIS — N1831 Chronic kidney disease, stage 3a: Secondary | ICD-10-CM

## 2021-07-10 ENCOUNTER — Encounter: Payer: Self-pay | Admitting: Orthopaedic Surgery

## 2021-07-10 ENCOUNTER — Ambulatory Visit (INDEPENDENT_AMBULATORY_CARE_PROVIDER_SITE_OTHER): Payer: Self-pay

## 2021-07-10 ENCOUNTER — Other Ambulatory Visit: Payer: Self-pay

## 2021-07-10 ENCOUNTER — Ambulatory Visit (INDEPENDENT_AMBULATORY_CARE_PROVIDER_SITE_OTHER): Payer: Self-pay | Admitting: Orthopaedic Surgery

## 2021-07-10 VITALS — BP 109/85 | HR 79 | Ht 67.0 in | Wt 143.8 lb

## 2021-07-10 DIAGNOSIS — M5442 Lumbago with sciatica, left side: Secondary | ICD-10-CM

## 2021-07-10 MED ORDER — CYCLOBENZAPRINE HCL 10 MG PO TABS: 10.0000 mg | ORAL_TABLET | Freq: Every day | ORAL | 0 refills | Status: AC

## 2021-07-10 MED ORDER — NAPROXEN 500 MG PO TABS: 500.0000 mg | ORAL_TABLET | Freq: Two times a day (BID) | ORAL | 5 refills | Status: AC

## 2021-07-10 MED ORDER — HYDROCODONE-ACETAMINOPHEN 5-325 MG PO TABS: ORAL_TABLET | ORAL | 0 refills | Status: DC

## 2021-07-10 NOTE — Progress Notes (Signed)
? ?Subjective:  ? ? Patient ID: David Carney, male    DOB: [birth year], 61 y.o.   MRN: [id number] ? ?HPI ?He has been having pain in the lower back for about a month now.  It is more on the left side and goes into the buttock on the left and then down the left thigh to just below the knee.  It is intense at times and other times just an ache.  He has no trauma, no heavy lifting, no untoward events. ? ?He has tried rest, ice, heat, Tylenol. ? ?He has been seen at [facility] and I have reviewed their notes.  He has been on Tramadol, Naprosyn, prednisone.  When he took the prednisone he had good relief for about a week then the pain returned. ? ?He has good and bad days. ? ?He is tired of hurting.  He is active but not so with the back pain. ? ? ?Review of Systems  ?Constitutional:  Positive for activity change.  ?Musculoskeletal:  Positive for arthralgias, back pain and myalgias.  ?All other systems reviewed and are negative. ?For Review of Systems, all other systems reviewed and are negative. ? ?The following is a summary of the past history medically, past history surgically, known current medicines, social history and family history.  This information is gathered electronically by the computer from prior information and documentation.  I review this each visit and have found including this information at this point in the chart is beneficial and informative.  ? ?Past Medical History:  ?Diagnosis Date  ? Heart murmur   ? Hypertension   ? ? ?History reviewed. No pertinent surgical history. ? ?Current Outpatient Medications on File Prior to Visit  ?Medication Sig Dispense Refill  ? aspirin EC 81 MG tablet Take 81 mg by mouth daily.    ? hydrochlorothiazide (HYDRODIURIL) 25 MG tablet Take 25 mg by mouth daily.    ? Omega-3 Fatty Acids (FISH OIL) 1200 MG CAPS Take 1,200 mg by mouth daily.    ? ?No current facility-administered medications on file prior to visit.  ? ? ?Social History  ? ?Socioeconomic History  ? Marital status:  Married  ?  Spouse name: Not on file  ? Number of children: Not on file  ? Years of education: Not on file  ? Highest education level: Not on file  ?Occupational History  ? Not on file  ?Tobacco Use  ? Smoking status: Every Day  ?  Packs/day: 1.00  ?  Types: Cigarettes  ? Smokeless tobacco: Never  ?Substance and Sexual Activity  ? Alcohol use: Yes  ?  Comment: on weekends  ? Drug use: No  ? Sexual activity: Not on file  ?Other Topics Concern  ? Not on file  ?Social History Narrative  ? Not on file  ? ?Social Determinants of Health  ? ?Financial Resource Strain: Not on file  ?Food Insecurity: Not on file  ?Transportation Needs: Not on file  ?Physical Activity: Not on file  ?Stress: Not on file  ?Social Connections: Not on file  ?Intimate Partner Violence: Not on file  ? ? ?History reviewed. No pertinent family history. ? ?BP 109/85   Pulse 79   Ht '5\' 7"'$  (1.702 m)   Wt 143 lb 12.8 oz (65.2 kg)   BMI 22.52 kg/m?  ? ?Body mass index is 22.52 kg/m?. ? ?   ?Objective:  ? Physical Exam ?Vitals and nursing note reviewed. Exam conducted with a chaperone present.  ?Constitutional:   ?  Appearance: He is well-developed.  ?HENT:  ?   Head: Normocephalic and atraumatic.  ?Eyes:  ?   Conjunctiva/sclera: Conjunctivae normal.  ?   Pupils: Pupils are equal, round, and reactive to light.  ?Cardiovascular:  ?   Rate and Rhythm: Normal rate and regular rhythm.  ?Pulmonary:  ?   Effort: Pulmonary effort is normal.  ?Abdominal:  ?   Palpations: Abdomen is soft.  ?Musculoskeletal:  ?     Arms: ? ?   Cervical back: Normal range of motion and neck supple.  ?Skin: ?   General: Skin is warm and dry.  ?Neurological:  ?   Mental Status: He is alert and oriented to person, place, and time.  ?   Cranial Nerves: No cranial nerve deficit.  ?   Motor: No abnormal muscle tone.  ?   Coordination: Coordination normal.  ?   Deep Tendon Reflexes: Reflexes are normal and symmetric. Reflexes normal.  ?Psychiatric:     ?   Behavior: Behavior normal.      ?   Thought Content: Thought content normal.     ?   Judgment: Judgment normal.  ?X-rays were done of the lumbar spine, reported separately.  Negative. ? ? ? ? ?   ?Assessment & Plan:  ? ?Encounter Diagnosis  ?Name Primary?  ? Acute back pain with sciatica, left Yes  ? ?I am concerned about possible HNP. ? ?I will give Flexeril, Naprosyn, Norco. ? ?I have reviewed the [place] web site prior to prescribing narcotic medicine for this patient. ? ?Return in three weeks. ? ?He may need MRI.  He has no insurance and I have told him about [employer].  He will contact them. ? ?Call if any problem. ? ?Precautions discussed. ? ?Electronically Signed ?Sanjuana Kava, MD ?3/21/202310:12 AM ? ?

## 2021-07-31 ENCOUNTER — Encounter: Payer: Self-pay | Admitting: Orthopaedic Surgery

## 2021-07-31 ENCOUNTER — Ambulatory Visit (INDEPENDENT_AMBULATORY_CARE_PROVIDER_SITE_OTHER): Payer: Self-pay | Admitting: Orthopaedic Surgery

## 2021-07-31 VITALS — BP 108/73 | HR 82 | Ht 67.0 in | Wt 143.0 lb

## 2021-07-31 DIAGNOSIS — M5442 Lumbago with sciatica, left side: Secondary | ICD-10-CM

## 2021-07-31 MED ORDER — HYDROCODONE-ACETAMINOPHEN 5-325 MG PO TABS: ORAL_TABLET | ORAL | 0 refills | Status: AC

## 2021-07-31 NOTE — Progress Notes (Signed)
My back hurts more in the morning. ? ?He has more back pain after he wakes up and it lasts a while then slowly gets better.  He has left sided sciatica.  The Naprosyn and flexeril have helped.  He is trying to be active.  He has no new trauma, no weakness. ? ?He did not inquire about [facility].  He is trying to get some insurance on his own. ? ?BP 108/73   Pulse 82   Ht '5\' 7"'$  (1.702 m)   Wt 143 lb (64.9 kg)   BMI 22.40 kg/m?  ? ?Spine/Pelvis examination: ? Inspection:  Overall, sacoiliac joint benign and hips nontender; without crepitus or defects. ? ? Thoracic spine inspection: Alignment normal without kyphosis present ? ? Lumbar spine inspection:  Alignment  with normal lumbar lordosis, without scoliosis apparent. ? ? Thoracic spine palpation:  without tenderness of spinal processes ? ? Lumbar spine palpation: without tenderness of lumbar area; without tightness of lumbar muscles  ? ? Range of Motion: ?  Lumbar flexion, forward flexion is normal without pain or tenderness  ?  Lumbar extension is full without pain or tenderness ?  Left lateral bend is normal without pain or tenderness ?  Right lateral bend is normal without pain or tenderness ?  Straight leg raising is normal ? Strength & tone: normal ? ? Stability overall normal stability ? ?Encounter Diagnosis  ?Name Primary?  ? Acute back pain with sciatica, left Yes  ? ?I will refill his pain medicine. ? ?Return in one month. ? ?Continue the Naprosyn and Flexeril. ? ?Consider MRI if he gets insurance. ? ?Call if any problem. ? ?Precautions discussed. ? ?Electronically Signed ?Sanjuana Kava, MD ?4/11/20238:02 AM ? ?

## 2021-08-07 ENCOUNTER — Other Ambulatory Visit: Payer: Self-pay

## 2021-08-07 ENCOUNTER — Telehealth: Payer: Self-pay | Admitting: Orthopaedic Surgery

## 2021-08-07 DIAGNOSIS — M5442 Lumbago with sciatica, left side: Secondary | ICD-10-CM

## 2021-08-07 NOTE — Telephone Encounter (Signed)
Spoke with patient. He stated that he should have insurance tomorrow.  Advised him that if he does not have insurance, he needs to call to discuss [facility] discount. Verbalizes understanding. Order placed for MRI ?

## 2021-08-07 NOTE — Telephone Encounter (Signed)
Patient called and states his pain isn't getting no better and he wants to know if Dr. Luna Glasgow can refer him to someone in [place].  He is hurting all day and night now.  Pain level on a scale of 1 to 10 he said his is 15.  ? ?He said Dr. Luna Glasgow asked him what he wanted to do when he saw him last time did he want him to refer him to another doctor. At that time he wanted to stay with Dr. Luna Glasgow now he wants to be referred to someone else.  ? ?He doesn't have any insurance.  ? ?Please call him back and advise him what to do.  ?

## 2021-08-28 ENCOUNTER — Ambulatory Visit: Payer: Self-pay | Admitting: Orthopaedic Surgery

## 2022-08-27 ENCOUNTER — Other Ambulatory Visit (HOSPITAL_COMMUNITY): Payer: Self-pay | Admitting: Nephrology

## 2022-08-27 DIAGNOSIS — N182 Chronic kidney disease, stage 2 (mild): Secondary | ICD-10-CM

## 2022-09-10 ENCOUNTER — Ambulatory Visit (HOSPITAL_COMMUNITY): Payer: Self-pay

## 2022-09-13 ENCOUNTER — Ambulatory Visit (HOSPITAL_COMMUNITY): Payer: BLUE CROSS/BLUE SHIELD

## 2022-09-13 ENCOUNTER — Encounter (HOSPITAL_COMMUNITY): Payer: Self-pay
